# Patient Record
Sex: Male | Born: 1995 | Race: White | Hispanic: No | Marital: Single | Smoking: Former smoker
Health system: Southern US, Community
[De-identification: ages and names within clinical notes are randomized; demographics above are authoritative.]

## PROBLEM LIST (undated history)

## (undated) DIAGNOSIS — J45909 Unspecified asthma, uncomplicated: Secondary | ICD-10-CM

## (undated) DIAGNOSIS — Q8500 Neurofibromatosis, unspecified: Secondary | ICD-10-CM

## (undated) HISTORY — PX: APPENDECTOMY: SHX54

---
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
## 2016-07-16 ENCOUNTER — Ambulatory Visit: Payer: Self-pay

## 2016-07-17 ENCOUNTER — Ambulatory Visit
Admission: RE | Admit: 2016-07-17 | Discharge: 2016-07-17 | Disposition: A | Discharge status: Home / Self Care | Payer: Managed Care, Other (non HMO) | Source: Ambulatory Visit | Attending: Cardiovascular Disease | Admitting: Cardiovascular Disease

## 2016-07-17 DIAGNOSIS — D219 Benign neoplasm of connective and other soft tissue, unspecified: Secondary | ICD-10-CM | POA: Insufficient documentation

## 2016-07-17 DIAGNOSIS — R9431 Abnormal electrocardiogram [ECG] [EKG]: Secondary | ICD-10-CM | POA: Diagnosis not present

## 2016-07-27 ENCOUNTER — Emergency Department
Admission: EM | Admit: 2016-07-27 | Discharge: 2016-07-27 | Disposition: A | Discharge status: Home / Self Care | Payer: Managed Care, Other (non HMO) | Attending: Emergency Medicine | Admitting: Emergency Medicine

## 2016-07-27 ENCOUNTER — Encounter: Payer: Self-pay | Admitting: Emergency Medicine

## 2016-07-27 ENCOUNTER — Emergency Department: Payer: Managed Care, Other (non HMO)

## 2016-07-27 DIAGNOSIS — Z87891 Personal history of nicotine dependence: Secondary | ICD-10-CM | POA: Insufficient documentation

## 2016-07-27 DIAGNOSIS — J45909 Unspecified asthma, uncomplicated: Secondary | ICD-10-CM | POA: Insufficient documentation

## 2016-07-27 DIAGNOSIS — R55 Syncope and collapse: Secondary | ICD-10-CM | POA: Insufficient documentation

## 2016-07-27 HISTORY — DX: Neurofibromatosis, unspecified: Q85.00

## 2016-07-27 HISTORY — DX: Unspecified asthma, uncomplicated: J45.909

## 2016-07-27 LAB — URINALYSIS, COMPLETE (UACMP) WITH MICROSCOPIC
BILIRUBIN URINE: NEGATIVE
Bacteria, UA: NONE SEEN
GLUCOSE, UA: NEGATIVE mg/dL
HGB URINE DIPSTICK: NEGATIVE
KETONES UR: NEGATIVE mg/dL
LEUKOCYTES UA: NEGATIVE
NITRITE: NEGATIVE
PH: 6 (ref 5.0–8.0)
Protein, ur: NEGATIVE mg/dL
RBC / HPF: NONE SEEN RBC/hpf (ref 0–5)
Specific Gravity, Urine: 1.018 (ref 1.005–1.030)

## 2016-07-27 LAB — BASIC METABOLIC PANEL
Anion gap: 7 (ref 5–15)
BUN: 17 mg/dL (ref 6–20)
CALCIUM: 9.5 mg/dL (ref 8.9–10.3)
CHLORIDE: 106 mmol/L (ref 101–111)
CO2: 29 mmol/L (ref 22–32)
CREATININE: 0.98 mg/dL (ref 0.61–1.24)
GFR calc non Af Amer: >60 mL/min (ref >60)
Glucose, Bld: 97 mg/dL (ref 65–99)
Potassium: 4 mmol/L (ref 3.5–5.1)
SODIUM: 142 mmol/L (ref 135–145)

## 2016-07-27 LAB — CBC
HCT: 44.3 % (ref 40.0–52.0)
Hemoglobin: 15.6 g/dL (ref 13.0–18.0)
MCH: 33 pg (ref 26.0–34.0)
MCHC: 35.3 g/dL (ref 32.0–36.0)
MCV: 93.4 fL (ref 80.0–100.0)
PLATELETS: 224 10*3/uL (ref 150–440)
RBC: 4.74 MIL/uL (ref 4.40–5.90)
RDW: 12.6 % (ref 11.5–14.5)
WBC: 5.6 10*3/uL (ref 3.8–10.6)

## 2016-07-27 LAB — TROPONIN I: Troponin I: <0.03 ng/mL (ref <0.03)

## 2016-07-27 MED ORDER — SODIUM CHLORIDE 0.9 % IV BOLUS (SEPSIS): 1000.0000 mL | Freq: Once | INTRAVENOUS | Status: DC

## 2016-07-27 MED ORDER — SODIUM CHLORIDE 0.9 % IV BOLUS (SEPSIS)
1000.0000 mL | Freq: Once | INTRAVENOUS | Status: AC
  Administered 2016-07-27: 1000 mL via INTRAVENOUS

## 2016-07-27 NOTE — ED Notes (Signed)
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

## 2016-07-27 NOTE — Discharge Instructions (Signed)
If you have any new or worrisome symptoms including chest pain shortness of breath lightheadedness or you feel worse in any way return to the emergency department. Do not drink caffeine in great quantities. Try to drink a cup or less a day of coffee and no soda. Follow closely as already scheduled with your cardiologist.

## 2016-07-27 NOTE — ED Triage Notes (Addendum)
Today felt light headed and some difficulty breathing.  Patient has history of wondering pacemaker and conduction delay.  Has a scheduled appointment with cardiology on March 6th.

## 2016-07-27 NOTE — ED Provider Notes (Addendum)
[facility] Emergency Department Provider Note  ____________________________________________   I have reviewed the triage vital signs and the nursing notes.   HISTORY  Chief Complaint Near Syncope    HPI Adam Odom is a 21 y.o. male Who presents today complaining of "feeling better". However, earlier today he was bending over he said quickly and felt lightheaded and short of breath for a few seconds. He had no chest pain. That completely resolved thereafter. 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 He therefore was sent in here by his primary care doctor for an EKG. EKG at that time, on February 16 SHOWED AN RSR prime configuration with biatrialent and rightward axis. The patient has never had any significant symptom I'll G with this and has an outpatient follow-up appointment with Dr. Caryl Comes at pediatric cardiology at [facility] in a few days., March 6. However, he was advised that if he has shortness of breath or feels faint and he should seek medical attention and since that happened today for a brief moment he thought that he would come see [place].At this time he has no symptoms.     Past Medical History:  Diagnosis Date  . Asthma   . Neurofibromatosis ([place])     There are no active problems to display for this patient.   Past Surgical History:  Procedure Laterality Date  . APPENDECTOMY      Prior to Admission medications   Not on File    Allergies Patient has no known allergies.  No family history on file.  Social History Social History  Substance Use Topics  . Smoking status: Former Smoker    Types: E-cigarettes    Quit date: 07/25/2016  . Smokeless tobacco: Never Used  . Alcohol use No    Review of Systems Constitutional: No fever/chills Eyes:  No visual changes. ENT: No sore throat. No stiff neck no neck pain Cardiovascular: Denies chest pain. Respiratory: Denies shortness of breath. Gastrointestinal:   no vomiting.  No diarrhea.  No constipation. Genitourinary: Negative for dysuria. Musculoskeletal: Negative lower extremity swelling Skin: Negative for rash. Neurological: Negative for severe headaches, focal weakness or numbness. 10-point ROS otherwise negative.  ____________________________________________   PHYSICAL EXAM:  VITAL SIGNS: ED Triage Vitals  Enc Vitals Group     BP 07/27/16 1532 (!) 142/85     Pulse Rate 07/27/16 1532 (!) 101     Resp 07/27/16 1532 16     Temp 07/27/16 1532 98.8 F (37.1 C)     Temp src --      SpO2 07/27/16 1532 100 %     Weight 07/27/16 1529 128 lb (58.1 kg)     Height 07/27/16 1529 5\' 9"  (1.753 m)     Head Circumference --      Peak Flow --      Pain Score 07/27/16 1529 0     Pain Loc --      Pain Edu? --      Excl. in [place]? --     Constitutional: Alert and oriented. Well appearing and in no acute distress. Eyes: Conjunctivae are normal. PERRL. EOMI. Head: Atraumatic. Nose: No congestion/rhinnorhea. Mouth/Throat: Mucous membranes are moist.  Oropharynx non-erythematous. Neck: No stridor.   Nontender with no meningismus Cardiovascular: Normal rate, regular rhythm. Grossly  normal heart sounds.  Good peripheral circulation. Respiratory: Normal respiratory effort.  No retractions. Lungs CTAB. Abdominal: Soft and nontender. No distention. No guarding no rebound Back:  There is no focal tenderness or step off.  there is no midline tenderness there are no lesions noted. there is no CVA tenderness Musculoskeletal: No lower extremity tenderness, no upper extremity tenderness. No joint effusions, no DVT signs strong distal pulses no edema Neurologic:  Normal speech and language. No gross focal neurologic deficits are appreciated.  Skin:  Skin is warm, dry and intact. No rash  noted. Psychiatric: Mood and affect are normal. Speech and behavior are normal.  ____________________________________________   LABS (all labs ordered are listed, but only abnormal results are displayed)  Labs Reviewed  BASIC METABOLIC PANEL  CBC  URINALYSIS, COMPLETE (UACMP) WITH MICROSCOPIC  TROPONIN I  CBG MONITORING, ED   ____________________________________________  EKG  I personally interpreted any EKGs ordered by me or triage EKG shows normal sinus rhythm rate 94 bpm, rightward axis, partial right bundle branch block,biatrial enlargement ____________________________________________  RADIOLOGY  I reviewed any imaging ordered by me or triage that were performed during my shift and, if possible, patient and/or family made aware of any abnormal findings. ____________________________________________   PROCEDURES  Procedure(s) performed: None  Procedures  Critical Care performed: None  ____________________________________________   INITIAL IMPRESSION / ASSESSMENT AND PLAN / ED COURSE  Pertinent labs & imaging results that were available during my care of the patient were reviewed by me and considered in my medical decision making (see chart for details).  Patient had a few moments of lightheadedness when he stood up quickly at work. Given that he had an abnormal EKG a few weeks ago family is here for further evaluation. He has no symptoms at this time his cardiac workup is unremarkable. We will get a chest x-ray and I will discuss with his cardiologist. Is my hope that he can safely go home with outpatient follow-up. We will do orthostatic vital signs very patient denies any other symptoms leading up to this and has had no exertional symptoms prior to or including today, and he has no complaints of chest painor anything else at this time   ----------------------------------------- 5:20 PM on 07/27/2016 ----------------------------------------- Patient laughing and  joking in no acute distress, he did have slight orthostatic with a slight increase in heart rate we will give him IV fluids which I think actually causes symptoms although he was asymptomatic here. I discussed with Dr. Valarie Cones of [facility] cardiology does not feel the patient needs to stay in the hospital he does not feel he needs an emergent care and he does not feel that we need to change his outpatient appointment. Patient and family very comfortable with this. We will give him IV fluids and ensure that he is not orthostatic afterwards. Patient had very vague symptoms of feeling slightly lightheaded when he stood up too quickly. There is no evidence of ACS PE dissection, atrial stenosis, outflow tract involvement by tumor, tumor in the heart causing any difficulty, pericarditis, pericardial effusion, pneumothorax or pneumonia or any other life-threatening disease process and his cardiologist's group would like him to be discharged. Patient has no symptoms or complaints here. We have advised her to take it easy for the next few days and drink plenty of fluids. Extensive return precautions and follow-up have been given and understood. A the patient states he had 4 cups of coffee today, I have advised him that this is a mild diuretic,  and he would be better off limiting his caffeine consumption which he states he will try to do.pt and family are very eager to go home and do not want to be admitted. Pt on cellphone in nad. Signed out to dr. Mariea Clonts for f/u on trop. ------------------     ____________________________________________   FINAL CLINICAL IMPRESSION(S) / ED DIAGNOSES  Final diagnoses:  None      This chart was dictated using voice recognition software.  Despite best efforts to proofread,  errors can occur which can change meaning.      Schuyler Amor, MD 07/27/16 [place], MD 07/27/16 [place], MD 07/27/16 [phone]

## 2016-07-27 NOTE — ED Notes (Signed)
Orthostatic Vitals Laying BP 130/82 Pulse 88 Sitting BP 138/84 Pulse 77 Standing BP 119/78 Pulse 101

## 2018-02-16 IMAGING — CR DG CHEST 2V
1 series · 2 of 2 positions shown · non-contrast
Comparison: Report of a chest x-ray June 16, 1999

CLINICAL DATA: Several days of dizziness, shortness of breath, and
increased blood pressure. Symptoms became worse today. History of
asthma, neurofibromatosis, and vaping.

EXAM:
CHEST  2 VIEW

[Series 1: dg chest 2 view · 0.14mm/px · 2 of 2 slices shown]
[im 1/2]
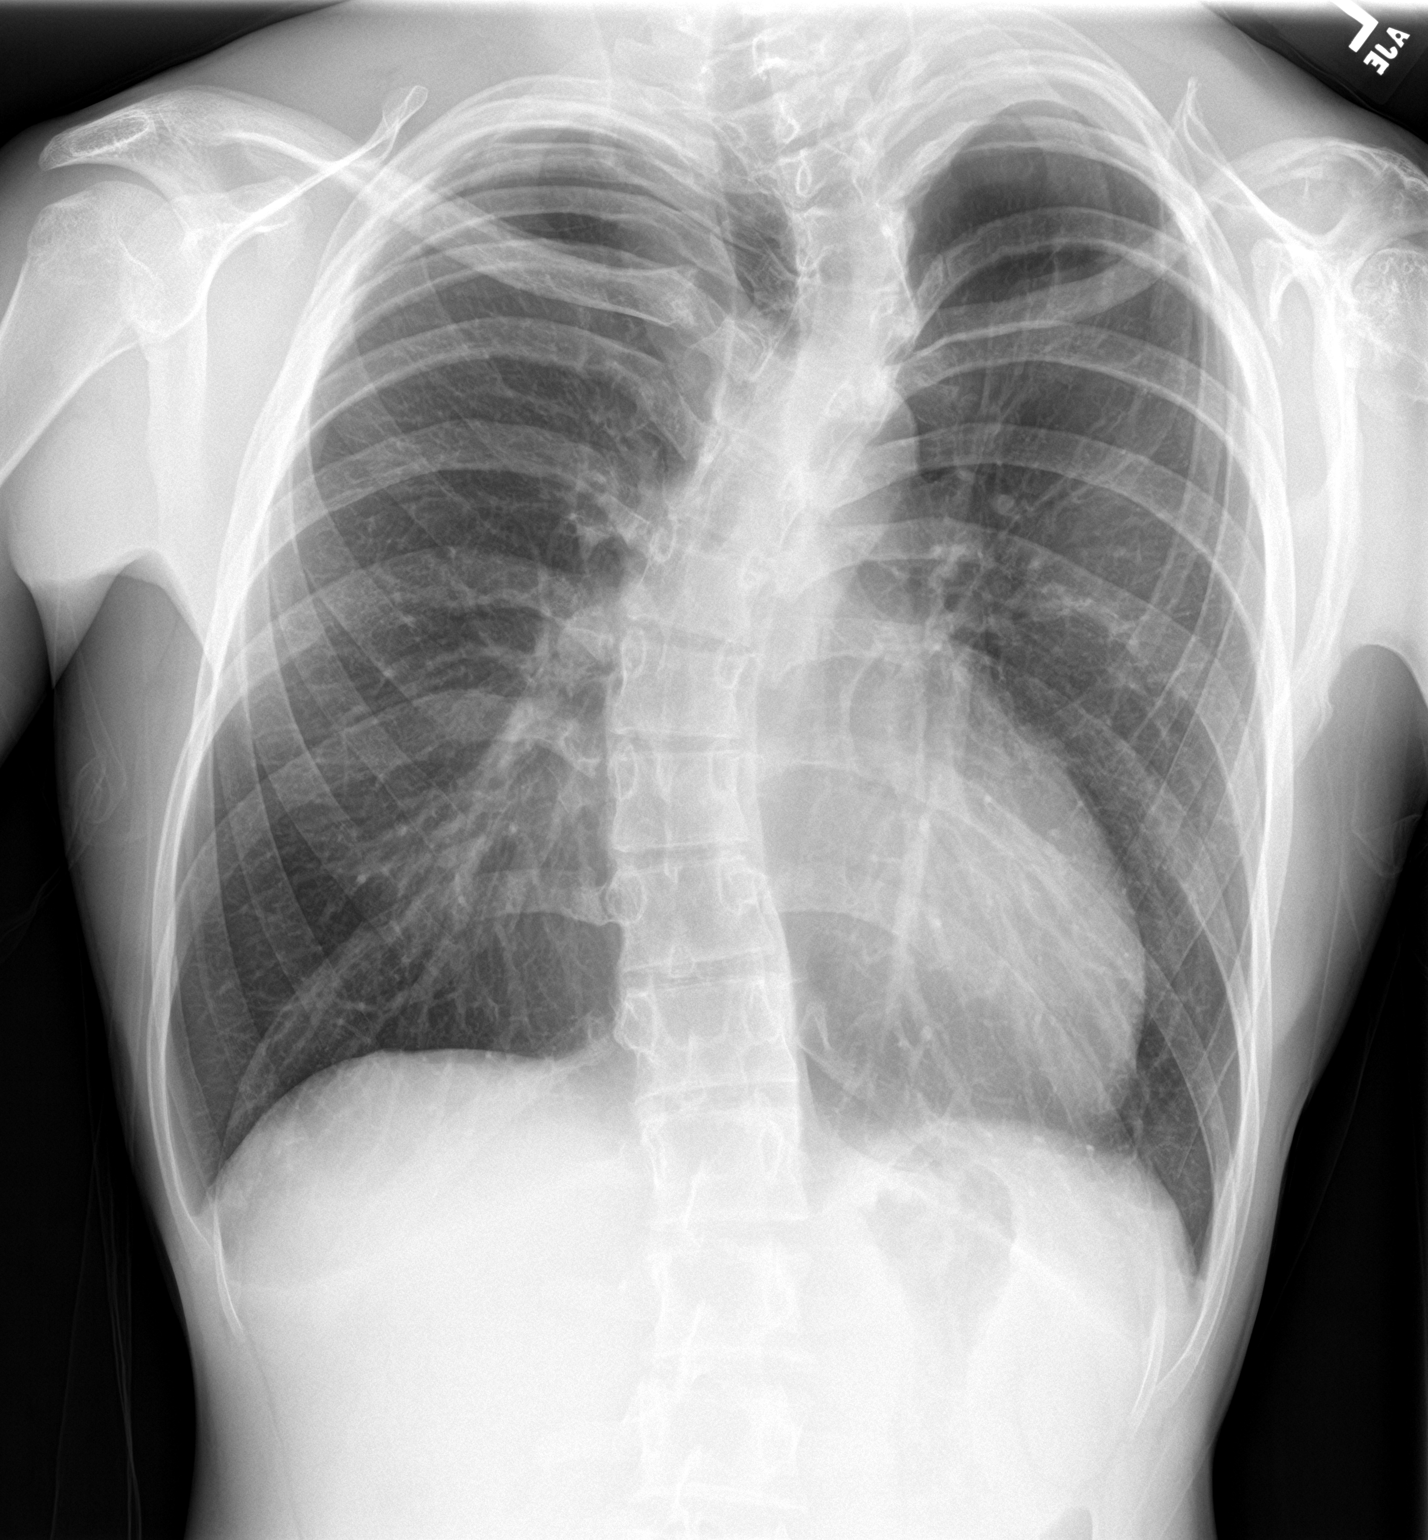
[im 2/2]
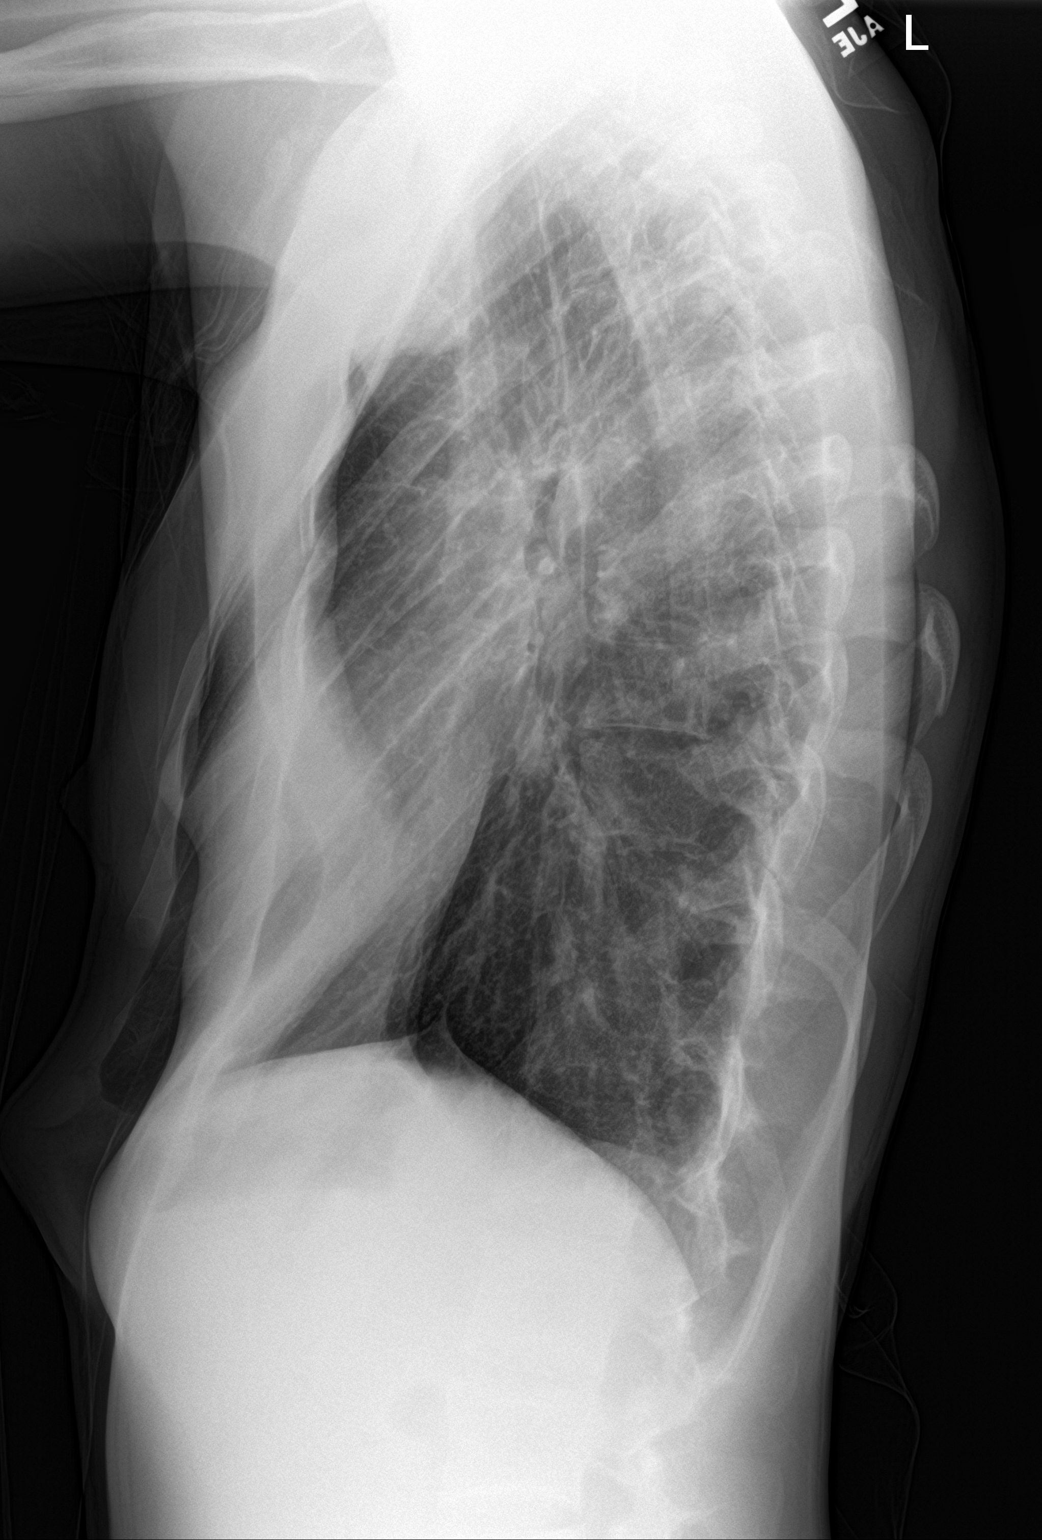

[2 of 2 positions shown; findings below may reference images not displayed]

FINDINGS: The lungs are hyperinflated and clear. The heart and pulmonary
vascularity are normal. The mediastinum is normal in width. There is
no pleural effusion. There is a pectus excavatum type chest contour.
Significant reverse S shaped thoracolumbar scoliosis is present.
IMPRESSION: There is no active cardiopulmonary disease.

## 2019-04-14 ENCOUNTER — Other Ambulatory Visit: Payer: Self-pay

## 2019-04-14 ENCOUNTER — Emergency Department
Admission: EM | Admit: 2019-04-14 | Discharge: 2019-04-14 | Disposition: A | Discharge status: Home / Self Care | Payer: PRIVATE HEALTH INSURANCE | Attending: Emergency Medicine | Admitting: Emergency Medicine

## 2019-04-14 DIAGNOSIS — M79602 Pain in left arm: Secondary | ICD-10-CM | POA: Diagnosis present

## 2019-04-14 DIAGNOSIS — Z5321 Procedure and treatment not carried out due to patient leaving prior to being seen by health care provider: Secondary | ICD-10-CM | POA: Diagnosis not present

## 2019-04-14 NOTE — ED Triage Notes (Signed)
Pt presents via POV c/o left arm pain. Reports hx dislocation. Refusing xray at this time due to payment issues.

## 2019-04-14 NOTE — ED Notes (Signed)
Pt notified staff pt was leaving due to shoulder popping back in place.

## 2021-01-11 ENCOUNTER — Ambulatory Visit
Admission: EM | Admit: 2021-01-11 | Discharge: 2021-01-11 | Disposition: A | Discharge status: Home / Self Care | Payer: BLUE CROSS/BLUE SHIELD | Attending: Family Medicine | Admitting: Family Medicine

## 2021-01-11 ENCOUNTER — Encounter: Payer: Self-pay | Admitting: Emergency Medicine

## 2021-01-11 ENCOUNTER — Other Ambulatory Visit: Payer: Self-pay

## 2021-01-11 DIAGNOSIS — B353 Tinea pedis: Secondary | ICD-10-CM | POA: Diagnosis not present

## 2021-01-11 MED ORDER — TERBINAFINE HCL 250 MG PO TABS: 250.0000 mg | ORAL_TABLET | Freq: Every day | ORAL | 0 refills | Status: AC

## 2021-01-11 NOTE — ED Triage Notes (Signed)
Patient states that he went tubing in the [place] last weekend.  Patient c/o blisters and sores on both his feet with lots of drainage.  Patient reports pain in both feet and toes.

## 2021-01-11 NOTE — Discharge Instructions (Addendum)
Keep the area as clean and dry as possible.  Medication as directed.  If this worsens or does not improve, see podiatry.  Take care  Dr. Lacinda Axon

## 2021-01-12 NOTE — ED Provider Notes (Signed)
[facility]    CSN: [id number] Arrival date & time: 01/11/21  1445      History   Chief Complaint Chief Complaint  Patient presents with   Blister    feet    HPI  25 year old male presents with a foot problem.  [place]  He has since developed severe maceration between all of his toes on both feet.  This causes moderate to severe pain.  He has been applying topical antifungal spray and some powder without relief.  Patient is also soak his feet in Clorox to "kill the bacteria".  No relieving factors.  No other complaints.  Past Medical History:  Diagnosis Date   Asthma    Neurofibromatosis [facility])    Past Surgical History:  Procedure Laterality Date   APPENDECTOMY      Home Medications    Prior to Admission medications   Medication Sig Start Date End Date Taking? Authorizing Provider  escitalopram (LEXAPRO) 10 MG tablet Take by mouth. 07/24/20  Yes [provider]  terbinafine (LAMISIL) 250 MG tablet Take 1 tablet (250 mg total) by mouth daily. 01/11/21  Yes Coral Spikes, DO   Social History Social History   Tobacco Use   Smoking status: Former    Types: E-cigarettes    Quit date: 07/25/2016    Years since quitting: 4.4   Smokeless tobacco: Never  Vaping Use   Vaping Use: Every day  Substance Use Topics   Alcohol use: No   Drug use: No     Allergies   Patient has no known allergies.   Review of Systems Review of Systems Per HPI  Physical Exam Triage Vital Signs ED Triage Vitals  Enc Vitals Group     BP 01/11/21 1512 (!) 141/85     Pulse Rate 01/11/21 1512 70     Resp 01/11/21 1512 16     Temp 01/11/21 1512 98.2 F (36.8 C)     Temp Source 01/11/21 1512 Oral     SpO2 01/11/21 1512 100 %     Weight 01/11/21 1509 165 lb (74.8 kg)     Height 01/11/21 1509 '5\' 11"'$  (1.803 m)     Head Circumference --      Peak Flow --      Pain Score 01/11/21 1509 7     Pain Loc --       Pain Edu? --      Excl. in [place]? --    No data found.  Updated Vital Signs BP (!) 141/85 (BP Location: Right Arm)   Pulse 70   Temp 98.2 F (36.8 C) (Oral)   Resp 16   Ht '5\' 11"'$  (1.803 m)   Wt 74.8 kg   SpO2 100%   BMI 23.01 kg/m   Visual Acuity Right Eye Distance:   Left Eye Distance:   Bilateral Distance:    Right Eye Near:   Left Eye Near:    Bilateral Near:     Physical Exam Vitals and nursing note reviewed.  Constitutional:      General: He is not in acute distress.    Appearance: He is not ill-appearing.  HENT:     Head: Normocephalic and atraumatic.  Pulmonary:     Effort: Pulmonary effort is normal. No respiratory distress.  Feet:     Comments: Severe interdigital maceration noted of both feet bilaterally. Neurological:     Mental Status: He is alert.  Psychiatric:        Mood and Affect: Mood normal.        Behavior: Behavior normal.     UC Treatments / Results  Labs (all labs ordered are listed, but only abnormal results are displayed) Labs Reviewed - No data to display  EKG   Radiology No results found.  Procedures Procedures (including critical care time)  Medications Ordered in UC Medications - No data to display  Initial Impression / Assessment and Plan / UC Course  I have reviewed the triage vital signs and the nursing notes.  Pertinent labs & imaging results that were available during my care of the patient were reviewed by me and considered in my medical decision making (see chart for details).    25 year old male presents with severe tinea pedis.  Treating with oral terbinafine.  Advised to keep his feet and toes clean and dry.  Final Clinical Impressions(s) / UC Diagnoses   Final diagnoses:  Tinea pedis of both feet     Discharge Instructions      Keep the area as clean and dry as possible.  Medication as directed.  If this worsens or does not improve, see podiatry.  Take care  Dr. Lacinda Axon    ED Prescriptions      Medication Sig Dispense Auth. Provider   terbinafine (LAMISIL) 250 MG tablet Take 1 tablet (250 mg total) by mouth daily. 14 tablet Coral Spikes, DO      PDMP not reviewed this encounter.   T[address] [place], [place] 01/12/21 [phone]
